# Patient Record
Sex: Female | Born: 1980 | Race: Black or African American | Hispanic: No | Marital: Single | Smoking: Never smoker
Health system: Southern US, Community
[De-identification: ages and names within clinical notes are randomized; demographics above are authoritative.]

## PROBLEM LIST (undated history)

## (undated) HISTORY — DX: Morbid (severe) obesity due to excess calories: E66.01

## (undated) HISTORY — PX: WRIST SURGERY: SHX841

## (undated) HISTORY — PX: CHOLECYSTECTOMY: SHX55

---

## 2002-04-28 ENCOUNTER — Other Ambulatory Visit: Admission: RE | Admit: 2002-04-28 | Discharge: 2002-04-28 | Payer: Self-pay | Admitting: *Deleted

## 2002-04-28 ENCOUNTER — Other Ambulatory Visit: Admission: RE | Admit: 2002-04-28 | Discharge: 2002-04-28 | Payer: Self-pay | Admitting: Obstetrics and Gynecology

## 2002-05-26 ENCOUNTER — Ambulatory Visit (HOSPITAL_COMMUNITY): Admission: RE | Admit: 2002-05-26 | Discharge: 2002-05-26 | Payer: Self-pay | Admitting: Obstetrics and Gynecology

## 2002-05-26 ENCOUNTER — Encounter: Payer: Self-pay | Admitting: Obstetrics and Gynecology

## 2002-09-27 ENCOUNTER — Encounter: Payer: Self-pay | Admitting: Obstetrics and Gynecology

## 2002-09-27 ENCOUNTER — Inpatient Hospital Stay (HOSPITAL_COMMUNITY): Admission: AD | Admit: 2002-09-27 | Discharge: 2002-10-01 | Payer: Self-pay | Admitting: *Deleted

## 2011-08-12 ENCOUNTER — Emergency Department: Payer: Self-pay | Admitting: Emergency Medicine

## 2014-01-09 ENCOUNTER — Emergency Department: Payer: Self-pay | Admitting: Emergency Medicine

## 2014-03-06 ENCOUNTER — Ambulatory Visit: Payer: Self-pay

## 2014-04-28 ENCOUNTER — Emergency Department: Payer: Self-pay | Admitting: Emergency Medicine

## 2014-04-28 LAB — LIPASE, BLOOD: LIPASE: 123 U/L (ref 73–393)

## 2014-04-28 LAB — COMPREHENSIVE METABOLIC PANEL
Albumin: 3.1 g/dL — ABNORMAL LOW (ref 3.4–5.0)
Alkaline Phosphatase: 41 U/L — ABNORMAL LOW (ref 46–116)
Anion Gap: 4 — ABNORMAL LOW (ref 7–16)
BUN: 9 mg/dL (ref 7–18)
Bilirubin,Total: 0.2 mg/dL (ref 0.2–1.0)
Calcium, Total: 8.3 mg/dL — ABNORMAL LOW (ref 8.5–10.1)
Chloride: 107 mmol/L (ref 98–107)
Co2: 28 mmol/L (ref 21–32)
Creatinine: 0.74 mg/dL (ref 0.60–1.30)
EGFR (African American): >60
EGFR (Non-African Amer.): >60
GLUCOSE: 96 mg/dL (ref 65–99)
Osmolality: 276 (ref 275–301)
Potassium: 4 mmol/L (ref 3.5–5.1)
SGOT(AST): 21 U/L (ref 15–37)
SGPT (ALT): 18 U/L (ref 14–63)
Sodium: 139 mmol/L (ref 136–145)
Total Protein: 6.7 g/dL (ref 6.4–8.2)

## 2014-04-28 LAB — CBC WITH DIFFERENTIAL/PLATELET
BASOS PCT: 0.6 %
Basophil #: 0 10*3/uL (ref 0.0–0.1)
Eosinophil #: 0.1 10*3/uL (ref 0.0–0.7)
Eosinophil %: 1.4 %
HCT: 40.3 % (ref 35.0–47.0)
HGB: 12.9 g/dL (ref 12.0–16.0)
LYMPHS PCT: 27.3 %
Lymphocyte #: 2.2 10*3/uL (ref 1.0–3.6)
MCH: 29.1 pg (ref 26.0–34.0)
MCHC: 32.1 g/dL (ref 32.0–36.0)
MCV: 91 fL (ref 80–100)
MONOS PCT: 8.2 %
Monocyte #: 0.7 x10 3/mm (ref 0.2–0.9)
NEUTROS ABS: 5.1 10*3/uL (ref 1.4–6.5)
Neutrophil %: 62.5 %
Platelet: 195 10*3/uL (ref 150–440)
RBC: 4.45 10*6/uL (ref 3.80–5.20)
RDW: 13.8 % (ref 11.5–14.5)
WBC: 8.2 10*3/uL (ref 3.6–11.0)

## 2014-04-28 LAB — URINALYSIS, COMPLETE
BILIRUBIN, UR: NEGATIVE
Blood: NEGATIVE
Glucose,UR: NEGATIVE mg/dL (ref 0–75)
Ketone: NEGATIVE
NITRITE: NEGATIVE
PROTEIN: NEGATIVE
Ph: 5 (ref 4.5–8.0)
Specific Gravity: 1.014 (ref 1.003–1.030)
Squamous Epithelial: 7

## 2019-07-21 ENCOUNTER — Other Ambulatory Visit: Payer: Self-pay

## 2019-07-21 DIAGNOSIS — U071 COVID-19: Secondary | ICD-10-CM | POA: Insufficient documentation

## 2019-07-21 DIAGNOSIS — R05 Cough: Secondary | ICD-10-CM | POA: Diagnosis present

## 2019-07-21 LAB — TROPONIN I (HIGH SENSITIVITY): Troponin I (High Sensitivity): 3 ng/L (ref <18)

## 2019-07-21 LAB — CBC
HCT: 42.5 % (ref 36.0–46.0)
Hemoglobin: 13.8 g/dL (ref 12.0–15.0)
MCH: 29.9 pg (ref 26.0–34.0)
MCHC: 32.5 g/dL (ref 30.0–36.0)
MCV: 92.2 fL (ref 80.0–100.0)
Platelets: 200 10*3/uL (ref 150–400)
RBC: 4.61 MIL/uL (ref 3.87–5.11)
RDW: 14.9 % (ref 11.5–15.5)
WBC: 4.2 10*3/uL (ref 4.0–10.5)
nRBC: 0 % (ref 0.0–0.2)

## 2019-07-21 LAB — BASIC METABOLIC PANEL
Anion gap: 5 (ref 5–15)
BUN: 11 mg/dL (ref 6–20)
CO2: 24 mmol/L (ref 22–32)
Calcium: 8.8 mg/dL — ABNORMAL LOW (ref 8.9–10.3)
Chloride: 111 mmol/L (ref 98–111)
Creatinine, Ser: 0.77 mg/dL (ref 0.44–1.00)
GFR calc Af Amer: >60 mL/min (ref >60)
GFR calc non Af Amer: >60 mL/min (ref >60)
Glucose, Bld: 81 mg/dL (ref 70–99)
Potassium: 3.4 mmol/L — ABNORMAL LOW (ref 3.5–5.1)
Sodium: 140 mmol/L (ref 135–145)

## 2019-07-21 MED ORDER — SODIUM CHLORIDE 0.9% FLUSH: 3.0000 mL | Freq: Once | INTRAVENOUS | Status: DC

## 2019-07-21 NOTE — ED Triage Notes (Signed)
PT to ED from home c/o body aches, cough with phlem, CP and back pain. PT treats with ibu and pain subsides but keeps coming back. No fevers.

## 2019-07-22 ENCOUNTER — Emergency Department: Payer: 59

## 2019-07-22 ENCOUNTER — Emergency Department
Admission: EM | Admit: 2019-07-22 | Discharge: 2019-07-22 | Disposition: A | Discharge status: Home / Self Care | Payer: 59 | Attending: Emergency Medicine | Admitting: Emergency Medicine

## 2019-07-22 DIAGNOSIS — R059 Cough, unspecified: Secondary | ICD-10-CM

## 2019-07-22 DIAGNOSIS — U071 COVID-19: Secondary | ICD-10-CM

## 2019-07-22 DIAGNOSIS — R079 Chest pain, unspecified: Secondary | ICD-10-CM

## 2019-07-22 DIAGNOSIS — R05 Cough: Secondary | ICD-10-CM

## 2019-07-22 LAB — HEPATIC FUNCTION PANEL
ALT: 16 U/L (ref 0–44)
AST: 19 U/L (ref 15–41)
Albumin: 3.9 g/dL (ref 3.5–5.0)
Alkaline Phosphatase: 51 U/L (ref 38–126)
Bilirubin, Direct: <0.1 mg/dL (ref 0.0–0.2)
Total Bilirubin: 0.7 mg/dL (ref 0.3–1.2)
Total Protein: 7.2 g/dL (ref 6.5–8.1)

## 2019-07-22 LAB — RESPIRATORY PANEL BY RT PCR (FLU A&B, COVID)
Influenza A by PCR: NEGATIVE
Influenza B by PCR: NEGATIVE
SARS Coronavirus 2 by RT PCR: POSITIVE — AB

## 2019-07-22 LAB — LIPASE, BLOOD: Lipase: 30 U/L (ref 11–51)

## 2019-07-22 LAB — FIBRIN DERIVATIVES D-DIMER (ARMC ONLY): Fibrin derivatives D-dimer (ARMC): 496.3 ng/mL (FEU) (ref 0.00–499.00)

## 2019-07-22 LAB — TROPONIN I (HIGH SENSITIVITY): Troponin I (High Sensitivity): <2 ng/L (ref <18)

## 2019-07-22 MED ORDER — PREDNISONE 20 MG PO TABS: ORAL_TABLET | ORAL | 0 refills | Status: AC

## 2019-07-22 MED ORDER — MAGIC MOUTHWASH
10.0000 mL | Freq: Once | ORAL | Status: AC
  Administered 2019-07-22: 10 mL via ORAL
  Filled 2019-07-22: qty 10

## 2019-07-22 MED ORDER — ALBUTEROL SULFATE HFA 108 (90 BASE) MCG/ACT IN AERS: 2.0000 | INHALATION_SPRAY | RESPIRATORY_TRACT | 0 refills | Status: AC | PRN

## 2019-07-22 MED ORDER — SODIUM CHLORIDE 0.9 % IV BOLUS
1000.0000 mL | Freq: Once | INTRAVENOUS | Status: AC
  Administered 2019-07-22: 03:00:00 1000 mL via INTRAVENOUS

## 2019-07-22 MED ORDER — PREDNISONE 20 MG PO TABS
60.0000 mg | ORAL_TABLET | Freq: Once | ORAL | Status: AC
  Administered 2019-07-22: 05:00:00 60 mg via ORAL
  Filled 2019-07-22: qty 3

## 2019-07-22 MED ORDER — KETOROLAC TROMETHAMINE 30 MG/ML IJ SOLN
10.0000 mg | Freq: Once | INTRAMUSCULAR | Status: AC
  Administered 2019-07-22: 03:00:00 9.9 mg via INTRAVENOUS
  Filled 2019-07-22: qty 1

## 2019-07-22 MED ORDER — AZITHROMYCIN 250 MG PO TABS: 250.0000 mg | ORAL_TABLET | Freq: Every day | ORAL | 0 refills | Status: AC

## 2019-07-22 MED ORDER — AZITHROMYCIN 500 MG PO TABS
500.0000 mg | ORAL_TABLET | Freq: Once | ORAL | Status: AC
  Administered 2019-07-22: 05:00:00 500 mg via ORAL
  Filled 2019-07-22: qty 1

## 2019-07-22 MED ORDER — HYDROCOD POLST-CPM POLST ER 10-8 MG/5ML PO SUER: 5.0000 mL | Freq: Two times a day (BID) | ORAL | 0 refills | Status: AC

## 2019-07-22 NOTE — ED Provider Notes (Signed)
[facility] Emergency Department Provider Note   ____________________________________________   First MD Initiated Contact with Patient 07/22/19 [phone]     (approximate)  I have reviewed the triage vital signs and the nursing notes.   HISTORY  Chief Complaint Chest Pain and Cough    HPI Elizabeth Kirk is a 39 y.o. female who presents to the ED from home with a chief complaint of body aches, productive cough, chest and back pain.  Symptoms x2 days.  No relief with ibuprofen.  Patient had known COVID-19 contact and was tested 2 weeks ago which returned negative.  She went to [facility] tonight and had a rapid Covid swab which was negative.  She was sent to the ED for further evaluation of chest and back pain.  Denies fever, shortness of breath, abdominal pain, nausea, vomiting or diarrhea.       Past medical history None  There are no problems to display for this patient.   Past Surgical History:  Procedure Laterality Date  . CESAREAN SECTION  2004  . CHOLECYSTECTOMY    . WRIST SURGERY      Prior to Admission medications   Medication Sig Start Date End Date Taking? Authorizing Provider  albuterol (VENTOLIN HFA) 108 (90 Base) MCG/ACT inhaler Inhale 2 puffs into the lungs every 4 (four) hours as needed for wheezing or shortness of breath. 07/22/19   Paulette Blanch, MD  azithromycin (ZITHROMAX) 250 MG tablet Take 1 tablet (250 mg total) by mouth daily. 07/22/19   Paulette Blanch, MD  chlorpheniramine-HYDROcodone (TUSSIONEX PENNKINETIC ER) 10-8 MG/5ML SUER Take 5 mLs by mouth 2 (two) times daily. 07/22/19   Paulette Blanch, MD  predniSONE (DELTASONE) 20 MG tablet 3 tablets daily x 4 days 07/22/19   Paulette Blanch, MD    Allergies Penicillins  No family history on file.  Social History Social History   Tobacco Use  . Smoking status: Never Smoker  . Smokeless tobacco: Never Used  Substance Use Topics  . Alcohol use: Yes  . Drug use: Not Currently     Review of Systems  Constitutional: Positive for body aches.  No fever/chills Eyes: No visual changes. ENT: Positive for sore throat. Cardiovascular: Positive for chest pain. Respiratory: Positive for productive cough.  Denies shortness of breath. Gastrointestinal: No abdominal pain.  No nausea, no vomiting.  No diarrhea.  No constipation. Genitourinary: Negative for dysuria. Musculoskeletal: Positive for back pain. Skin: Negative for rash. Neurological: Negative for headaches, focal weakness or numbness.   ____________________________________________   PHYSICAL EXAM:  VITAL SIGNS: ED Triage Vitals  Enc Vitals Group     BP 07/21/19 2202 131/89     Pulse Rate 07/21/19 2202 79     Resp 07/21/19 2202 (!) 24     Temp 07/21/19 2202 98.2 F (36.8 C)     Temp Source 07/21/19 2202 Oral     SpO2 07/21/19 2202 100 %     Weight 07/21/19 2202 208 lb (94.3 kg)     Height 07/21/19 2202 5\' 5"  (1.651 m)     Head Circumference --      Peak Flow --      Pain Score 07/21/19 2208 2     Pain Loc --      Pain Edu? --      Excl. in [place]? --     Constitutional: Alert and oriented. Well appearing and in no acute distress. Eyes: Conjunctivae are normal. PERRL. EOMI. Head: Atraumatic.  Nose: No congestion/rhinnorhea. Mouth/Throat: Mucous membranes are moist.  Oropharynx slightly erythematous without tonsillar exudate, swelling or peritonsillar abscess.  Mildly hoarse voice.  There is no muffled voice or drooling. Neck: No stridor.  Supple neck without meningismus. Cardiovascular: Normal rate, regular rhythm. Grossly normal heart sounds.  Good peripheral circulation. Respiratory: Normal respiratory effort.  No retractions. Lungs CTAB. Gastrointestinal: Soft and nontender. No distention. No abdominal bruits. No CVA tenderness. Musculoskeletal: No lower extremity tenderness nor edema.  No joint effusions. Neurologic:  Normal speech and language. No gross focal neurologic deficits are  appreciated. No gait instability. Skin:  Skin is warm, dry and intact. No rash noted.  No petechiae. Psychiatric: Mood and affect are normal. Speech and behavior are normal.  ____________________________________________   LABS (all labs ordered are listed, but only abnormal results are displayed)  Labs Reviewed  RESPIRATORY PANEL BY RT PCR (FLU A&B, COVID) - Abnormal; Notable for the following components:      Result Value   SARS Coronavirus 2 by RT PCR POSITIVE (*)    All other components within normal limits  BASIC METABOLIC PANEL - Abnormal; Notable for the following components:   Potassium 3.4 (*)    Calcium 8.8 (*)    All other components within normal limits  CBC  FIBRIN DERIVATIVES D-DIMER ([facility] ONLY)  HEPATIC FUNCTION PANEL  LIPASE, BLOOD  POC URINE PREG, ED  TROPONIN I (HIGH SENSITIVITY)  TROPONIN I (HIGH SENSITIVITY)   ____________________________________________  EKG  ED ECG REPORT I, Danija Gosa J, the attending physician, personally viewed and interpreted this ECG.   Date: 07/22/2019  EKG Time: 2155  Rate: 76  Rhythm: normal EKG, normal sinus rhythm  Axis: Normal  Intervals:none  ST&T Change: Nonspecific  ____________________________________________  RADIOLOGY  ED MD interpretation: No acute cardiopulmonary process  Official radiology report(s): DG Chest 2 View  Result Date: 07/22/2019 CLINICAL DATA:  Chest pain EXAM: CHEST - 2 VIEW COMPARISON:  None. FINDINGS: The heart size and mediastinal contours are within normal limits. Both lungs are clear. The visualized skeletal structures are unremarkable. IMPRESSION: No active cardiopulmonary disease. Electronically Signed   By: Deatra Robinson M.D.   On: 07/22/2019 02:08    ____________________________________________   PROCEDURES  Procedure(s) performed (including Critical Care):  Procedures   ____________________________________________   INITIAL IMPRESSION / ASSESSMENT AND PLAN / ED  COURSE  As part of my medical decision making, I reviewed the following data within the electronic MEDICAL RECORD NUMBER Nursing notes reviewed and incorporated, Labs reviewed, EKG interpreted, Old chart reviewed, Radiograph reviewed and Notes from prior ED visits     Shaletha Humble was evaluated in Emergency Department on 07/22/2019 for the symptoms described in the history of present illness. She was evaluated in the context of the global COVID-19 pandemic, which necessitated consideration that the patient might be at risk for infection with the SARS-CoV-2 virus that causes COVID-19. Institutional protocols and algorithms that pertain to the evaluation of patients at risk for COVID-19 are in a state of rapid change based on information released by regulatory bodies including the CDC and federal and state organizations. These policies and algorithms were followed during the patient's care in the ED.    39 year old female presenting with body aches, productive cough, chest and back pain. Differential includes, but is not limited to, viral syndrome, bronchitis including COPD exacerbation, pneumonia, reactive airway disease including asthma, CHF including exacerbation with or without pulmonary/interstitial edema, pneumothorax, ACS, thoracic trauma, and pulmonary embolism.  Laboratory results unremarkable thus  far; will check LFTs/lipase, D-dimer, repeat troponin.  Initiate IV fluid hydration, Magic mouthwash for sore throat, IV Toradol for body aches.  Patient consents to PCR COVID-19 test.   Clinical Course as of Jul 22 539  Fri Jul 22, 2019  0505 Updated patient on Covid + result.  D-dimer and troponin unremarkable.  Will discharge home on azithromycin, prednisone, Tussionex and albuterol inhaler.  Strict return precautions given.  Patient verbalizes understanding agrees with plan of care.   [JS]    Clinical Course User Index [JS] Irean Hong, MD      ____________________________________________   FINAL CLINICAL IMPRESSION(S) / ED DIAGNOSES  Final diagnoses:  Cough  Chest pain, unspecified type  COVID-19     ED Discharge Orders         Ordered    albuterol (VENTOLIN HFA) 108 (90 Base) MCG/ACT inhaler  Every 4 hours PRN     07/22/19 0432    predniSONE (DELTASONE) 20 MG tablet     07/22/19 0432    chlorpheniramine-HYDROcodone (TUSSIONEX PENNKINETIC ER) 10-8 MG/5ML SUER  2 times daily     07/22/19 0432    azithromycin (ZITHROMAX) 250 MG tablet  Daily     07/22/19 0432           Note:  This document was prepared using Dragon voice recognition software and may include unintentional dictation errors.   Irean Hong, MD 07/22/19 [phone]

## 2019-07-22 NOTE — Discharge Instructions (Addendum)
Finish antibiotic as prescribed (Azithromycin 250 mg daily x4 days).  You may start your next dose Saturday morning. Finish steroid as prescribed (Prednisone 60 mg daily x4 days).  You may start your next dose Saturday morning. Take Tussionex as needed for cough. Use Albuterol inhaler 2 puffs every 4 hours as needed for cough/difficulty breathing. Return to the ER for worsening symptoms, persistent vomiting, difficulty breathing or other concerns.

## 2019-07-23 ENCOUNTER — Encounter: Payer: Self-pay | Admitting: Physician Assistant

## 2019-07-23 ENCOUNTER — Telehealth: Payer: Self-pay | Admitting: Physician Assistant

## 2019-07-23 NOTE — Telephone Encounter (Signed)
Called to discuss with patient about Covid symptoms and the use of bamlanivimab/etesevimab or casirivimab/imdevimab, a monoclonal antibody infusion for those with mild to moderate Covid symptoms and at a high risk of hospitalization.  Pt is qualified for this infusion at the [facility] infusion center due to BMI>35. Need to clarify weight.   Message left to call back and also sent a mychart message.  Cline Crock PA-C  [facility]

## 2020-07-06 ENCOUNTER — Encounter: Payer: Self-pay | Admitting: Emergency Medicine

## 2020-07-06 ENCOUNTER — Ambulatory Visit
Admission: EM | Admit: 2020-07-06 | Discharge: 2020-07-06 | Disposition: A | Discharge status: Home / Self Care | Payer: 59

## 2020-07-06 ENCOUNTER — Other Ambulatory Visit: Payer: Self-pay

## 2020-07-06 DIAGNOSIS — J069 Acute upper respiratory infection, unspecified: Secondary | ICD-10-CM | POA: Diagnosis not present

## 2020-07-06 DIAGNOSIS — R079 Chest pain, unspecified: Secondary | ICD-10-CM | POA: Diagnosis not present

## 2020-07-06 NOTE — ED Provider Notes (Signed)
[name]    CSN: [id number] Arrival date & time: 07/06/20  1342      History   Chief Complaint Chief Complaint  Patient presents with  . Chest Pain    HPI Elizabeth Kirk is a 40 y.o. female.   Patient presents with 2-day history of external chest pain.  She describes the pain as sharp, currently 6/10, brief, intermittent.  She reports no aggravating or alleviating factors.  She states the pain comes on suddenly and last a few minutes, then resolves without intervention.  She also reports hoarse voice and productive cough.  She denies dizziness, weakness, numbness, fever, chills, shortness of breath, abdominal pain, or other symptoms.  Treatment attempted at home with Mucinex.  She was seen at other urgent cares last year for atypical chest pain.  Her medical history includes morbid obesity.  The history is provided by the patient and medical records.    Past Medical History:  Diagnosis Date  . Morbid obesity [facility])     Patient Active Problem List   Diagnosis Date Noted  . Morbid obesity (HCC)     Past Surgical History:  Procedure Laterality Date  . CESAREAN SECTION  2004  . CHOLECYSTECTOMY    . WRIST SURGERY      OB History   No obstetric history on file.      Home Medications    Prior to Admission medications   Medication Sig Start Date End Date Taking? Authorizing Provider  phentermine 37.5 MG capsule Take 37.5 mg by mouth every morning.   Yes [provider]  topiramate (TOPAMAX) 50 MG tablet Take 50 mg by mouth 2 (two) times daily. 06/19/20  Yes [provider]  albuterol (VENTOLIN HFA) 108 (90 Base) MCG/ACT inhaler Inhale 2 puffs into the lungs every 4 (four) hours as needed for wheezing or shortness of breath. 07/22/19   Irean Hong, MD  azithromycin (ZITHROMAX) 250 MG tablet Take 1 tablet (250 mg total) by mouth daily. 07/22/19   Irean Hong, MD  chlorpheniramine-HYDROcodone (TUSSIONEX PENNKINETIC ER) 10-8 MG/5ML SUER Take  5 mLs by mouth 2 (two) times daily. 07/22/19   Irean Hong, MD  predniSONE (DELTASONE) 20 MG tablet 3 tablets daily x 4 days 07/22/19   Irean Hong, MD    Family History History reviewed. No pertinent family history.  Social History Social History   Tobacco Use  . Smoking status: Never Smoker  . Smokeless tobacco: Never Used  Vaping Use  . Vaping Use: Never used  Substance Use Topics  . Alcohol use: Yes  . Drug use: Not Currently     Allergies   Penicillins   Review of Systems Review of Systems  Constitutional: Negative for chills and fever.  HENT: Positive for voice change. Negative for ear pain and sore throat.   Eyes: Negative for pain and visual disturbance.  Respiratory: Positive for cough. Negative for shortness of breath.   Cardiovascular: Positive for chest pain. Negative for palpitations.  Gastrointestinal: Negative for abdominal pain, diarrhea and vomiting.  Genitourinary: Negative for dysuria and hematuria.  Musculoskeletal: Negative for arthralgias and back pain.  Skin: Negative for color change and rash.  Neurological: Negative for dizziness, syncope, weakness and numbness.  All other systems reviewed and are negative.    Physical Exam Triage Vital Signs ED Triage Vitals  Enc Vitals Group     BP      Pulse      Resp  Temp      Temp src      SpO2      Weight      Height      Head Circumference      Peak Flow      Pain Score      Pain Loc      Pain Edu?      Excl. in [place]?    No data found.  Updated Vital Signs BP 121/81 (BP Location: Left Arm)   Pulse 81   Temp 98.7 F (37.1 C) (Oral)   Resp 18   LMP 06/09/2020   SpO2 98%   Visual Acuity Right Eye Distance:   Left Eye Distance:   Bilateral Distance:    Right Eye Near:   Left Eye Near:    Bilateral Near:     Physical Exam Vitals and nursing note reviewed.  Constitutional:      General: She is not in acute distress.    Appearance: She is well-developed. She is not  ill-appearing.  HENT:     Head: Normocephalic and atraumatic.     Right Ear: Tympanic membrane normal.     Left Ear: Tympanic membrane normal.     Nose: Nose normal.     Mouth/Throat:     Mouth: Mucous membranes are moist.     Pharynx: Oropharynx is clear.  Eyes:     Conjunctiva/sclera: Conjunctivae normal.  Cardiovascular:     Rate and Rhythm: Normal rate and regular rhythm.     Heart sounds: Normal heart sounds.  Pulmonary:     Effort: Pulmonary effort is normal. No respiratory distress.     Breath sounds: Normal breath sounds.  Abdominal:     Palpations: Abdomen is soft.     Tenderness: There is no abdominal tenderness.  Musculoskeletal:     Cervical back: Neck supple.  Skin:    General: Skin is warm and dry.  Neurological:     General: No focal deficit present.     Mental Status: She is alert and oriented to person, place, and time.     Gait: Gait normal.  Psychiatric:        Mood and Affect: Mood normal.        Behavior: Behavior normal.      UC Treatments / Results  Labs (all labs ordered are listed, but only abnormal results are displayed) Labs Reviewed  NOVEL CORONAVIRUS, NAA    EKG   Radiology No results found.  Procedures Procedures (including critical care time)  Medications Ordered in UC Medications - No data to display  Initial Impression / Assessment and Plan / UC Course  I have reviewed the triage vital signs and the nursing notes.  Pertinent labs & imaging results that were available during my care of the patient were reviewed by me and considered in my medical decision making (see chart for details).   Nonspecific chest pain.  Viral URI with cough.  EKG shows sinus rhythm, rate 78; no ST elevation; compared to previous from April 2021.  Discussed with patient the limitations of evaluation of chest pain in an urgent care setting.  Instructed her to go to the ED for evaluation of her chest pain.  Her vital signs are stable and she appears  well; her exam is reassuring.  PCR COVID pending.  Instructed patient to self quarantine until the test result is back.  Discussed symptomatic treatment.  Instructed her to follow-up with her PCP.  She agrees to  plan of care.   Final Clinical Impressions(s) / UC Diagnoses   Final diagnoses:  Chest pain, unspecified type  Viral URI with cough     Discharge Instructions     Go to the emergency department for evaluation of your chest pain.    Your COVID test is pending.  You should self quarantine until the test result is back.    Take Tylenol or ibuprofen as needed for fever or discomfort.  Rest and keep yourself hydrated.    Follow-up with your primary care provider if your symptoms are not improving.        ED Prescriptions    None     PDMP not reviewed this encounter.   Mickie Bail, NP 07/06/20 1429

## 2020-07-06 NOTE — ED Notes (Signed)
Patient is being discharged from the Urgent Care and sent to the Emergency Department via Personal Vehicle. Per Tresa Endo NP, patient is in need of higher level of care due to Chest Pain. Patient is aware and verbalizes understanding of plan of care.  Vitals:   07/06/20 1357  BP: 121/81  Pulse: 81  Resp: 18  Temp: 98.7 F (37.1 C)  SpO2: 98%

## 2020-07-06 NOTE — Discharge Instructions (Signed)
Go to the emergency department for evaluation of your chest pain.    Your COVID test is pending.  You should self quarantine until the test result is back.    Take Tylenol or ibuprofen as needed for fever or discomfort.  Rest and keep yourself hydrated.    Follow-up with your primary care provider if your symptoms are not improving.

## 2020-07-06 NOTE — ED Triage Notes (Signed)
Patient c/o chest pain x 2 days.   Patient endorses pain is located "in the middle and it feels like sharp pains".    Patient endorses a horse voice.   Patient denies Fever, SOB, of Dizziness.   Patient endorses a productive cough w/ " light thick mucus" sputum.   Patient took at home COVID-19 test with a negative result.   Patient has taken OTC Mucinex with no relief of symptoms.

## 2020-07-07 LAB — NOVEL CORONAVIRUS, NAA: SARS-CoV-2, NAA: NOT DETECTED

## 2020-07-07 LAB — SARS-COV-2, NAA 2 DAY TAT

## 2022-03-27 IMAGING — CR DG CHEST 2V
2 series · 2 of 2 positions shown · non-contrast
Comparison: None.

CLINICAL DATA: Chest pain

EXAM:
CHEST - 2 VIEW

[chest pa]
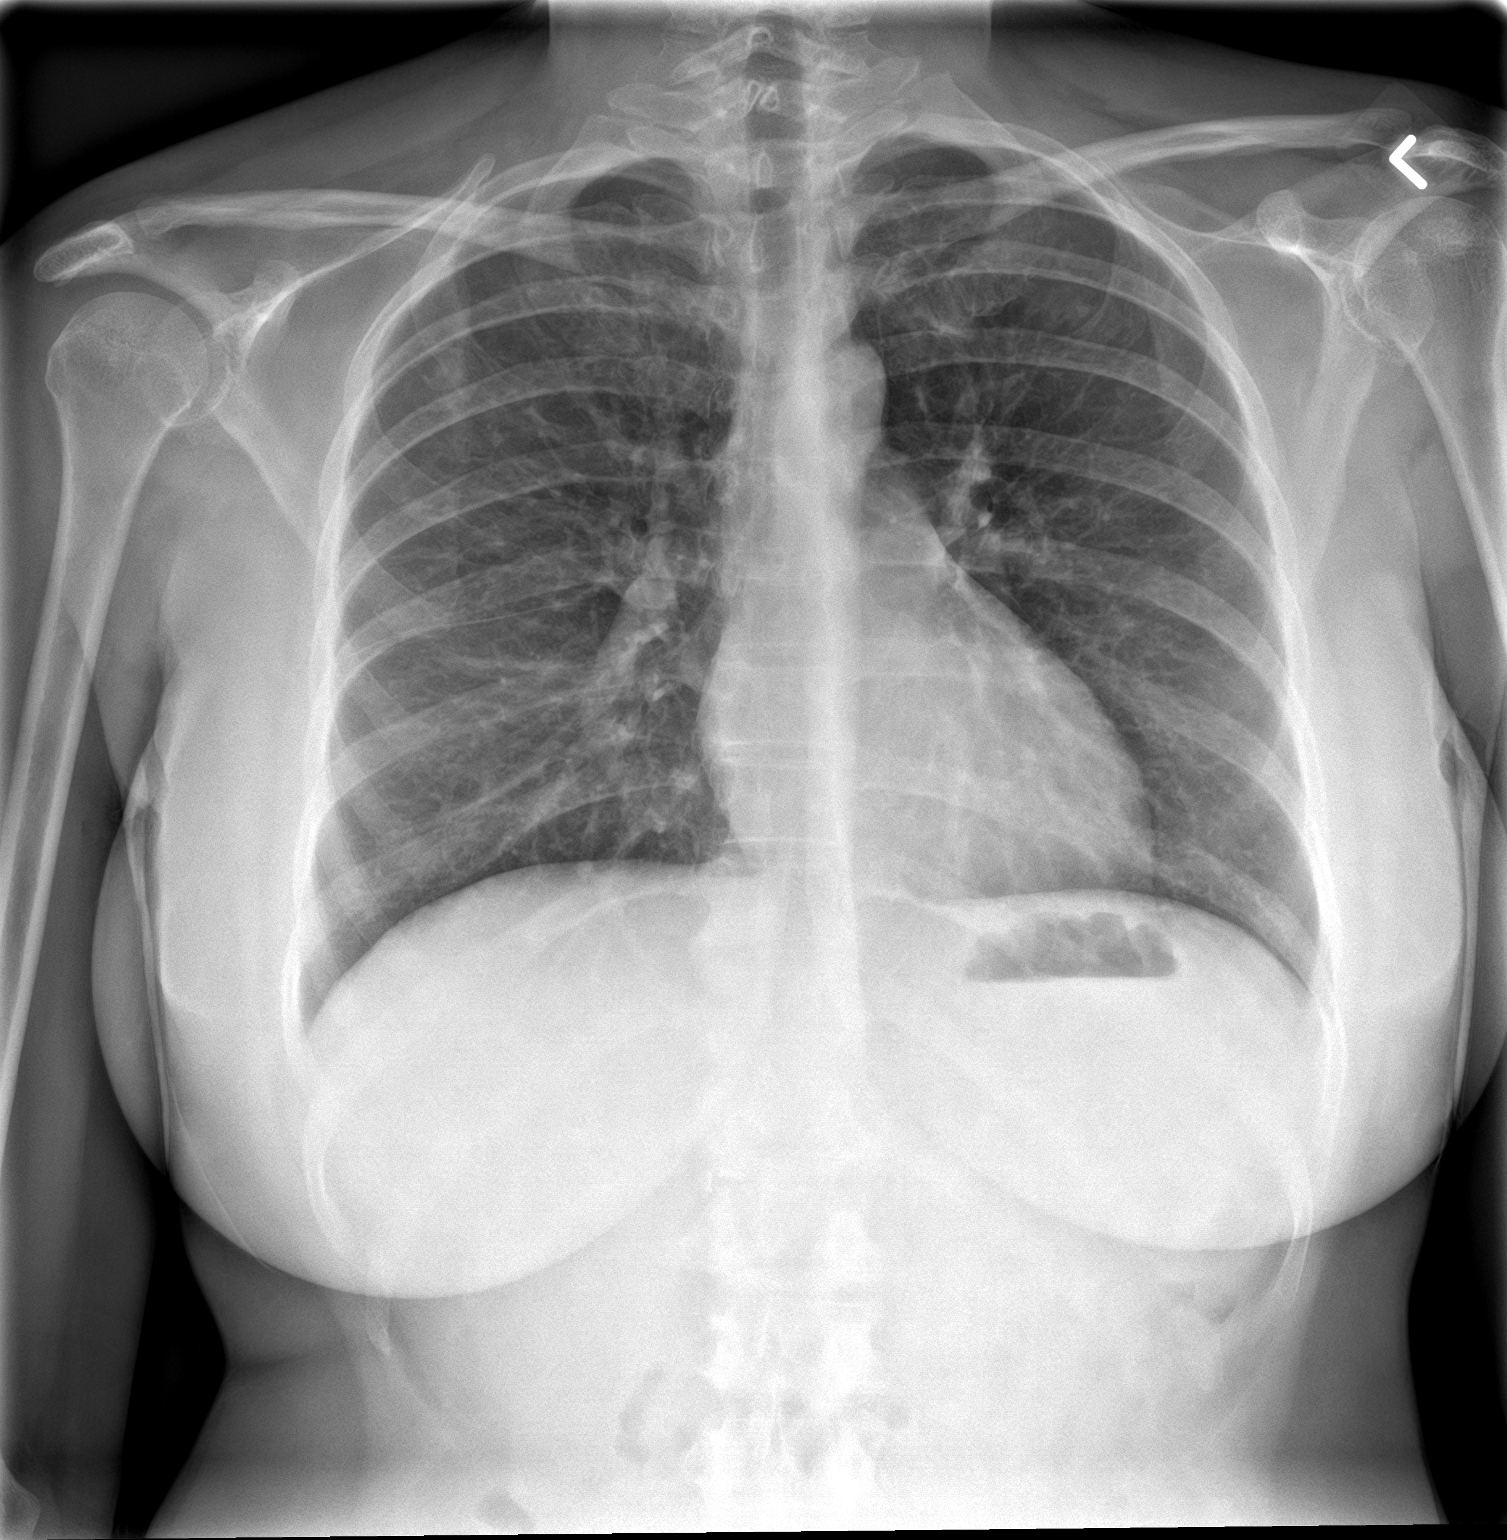

[chest lat]
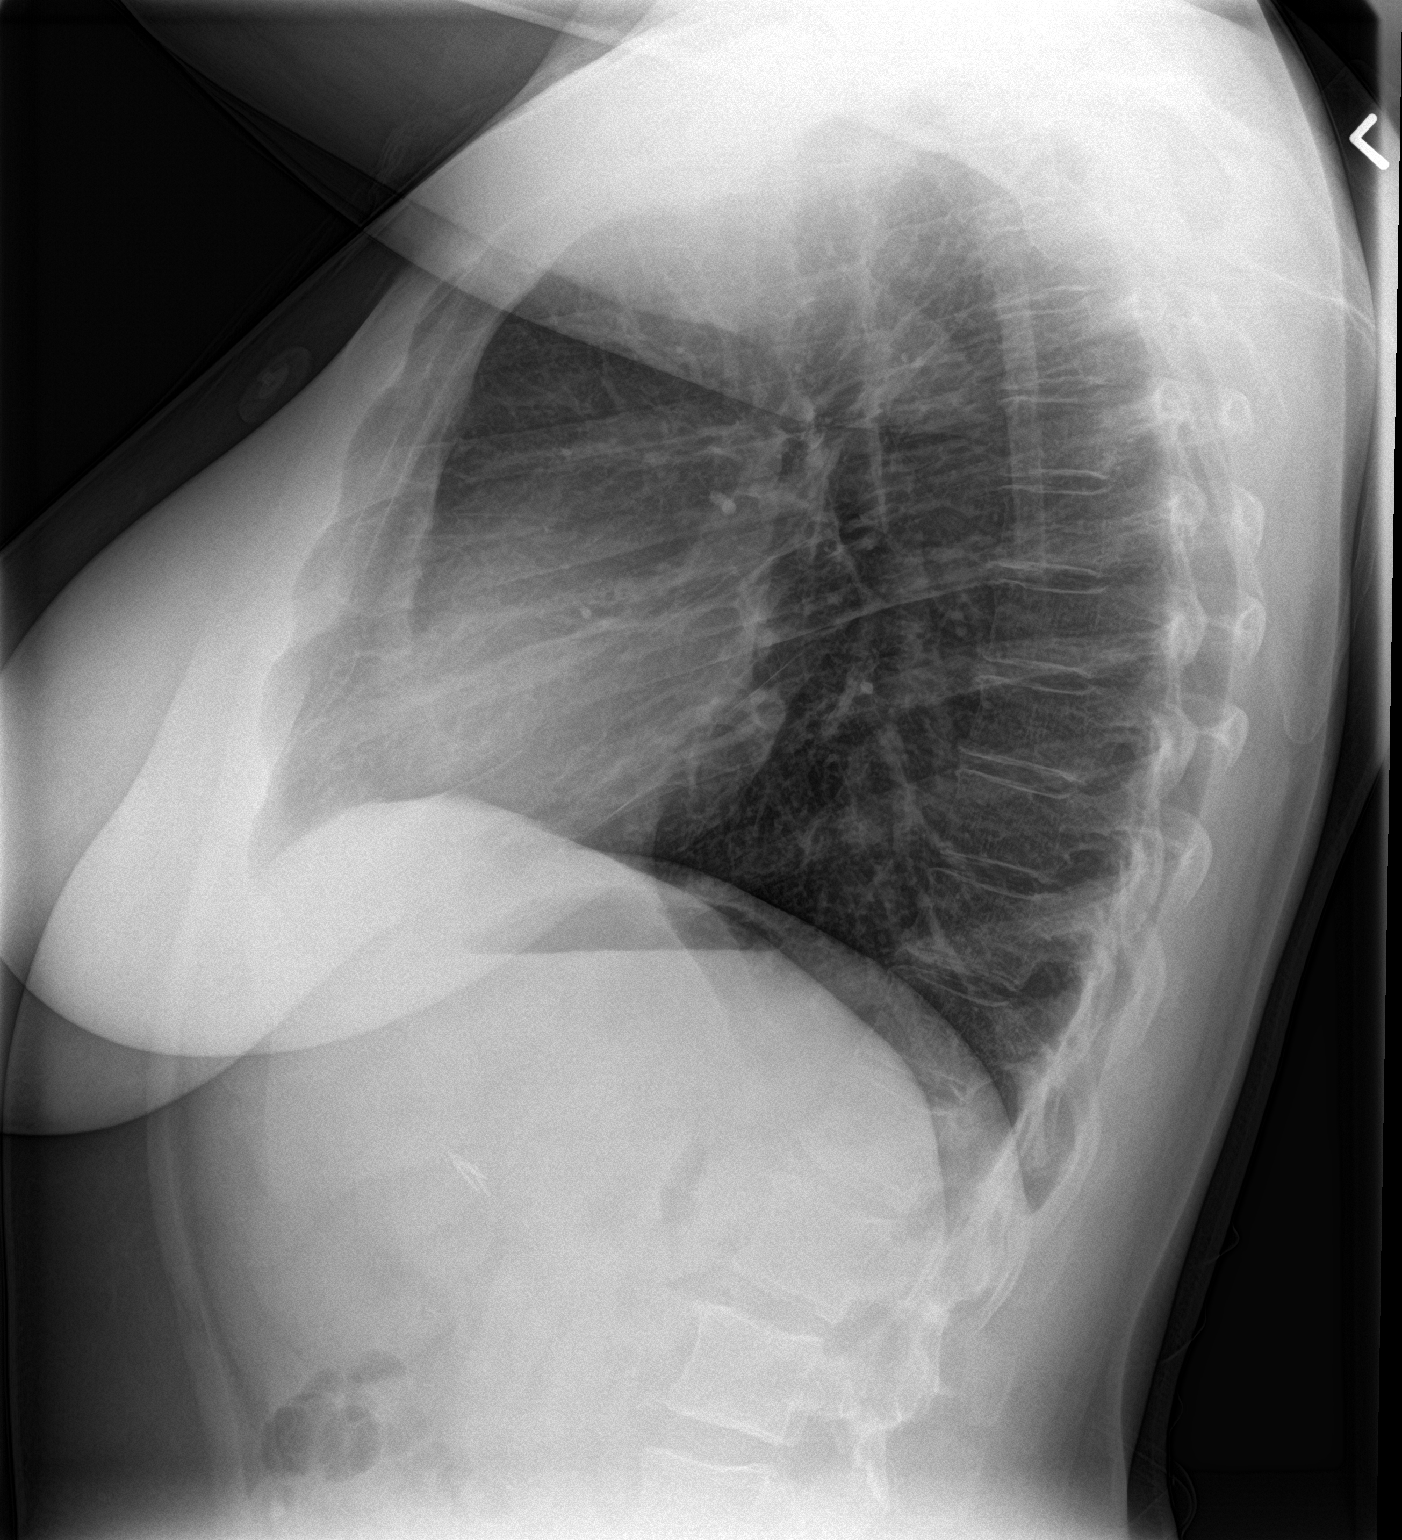

[2 of 2 positions shown; findings below may reference images not displayed]

FINDINGS: The heart size and mediastinal contours are within normal limits.
Both lungs are clear. The visualized skeletal structures are
unremarkable.
IMPRESSION: No active cardiopulmonary disease.
